# Patient Record
Sex: Female | Born: 1945 | Race: White | Hispanic: No | Marital: Single | Smoking: Never smoker
Health system: Southern US, Community
[De-identification: ages and names within clinical notes are randomized; demographics above are authoritative.]

## PROBLEM LIST (undated history)

## (undated) DIAGNOSIS — K851 Biliary acute pancreatitis without necrosis or infection: Secondary | ICD-10-CM

## (undated) DIAGNOSIS — I10 Essential (primary) hypertension: Secondary | ICD-10-CM

## (undated) DIAGNOSIS — N189 Chronic kidney disease, unspecified: Secondary | ICD-10-CM

## (undated) DIAGNOSIS — R519 Headache, unspecified: Secondary | ICD-10-CM

## (undated) DIAGNOSIS — R7982 Elevated C-reactive protein (CRP): Secondary | ICD-10-CM

## (undated) DIAGNOSIS — E28319 Asymptomatic premature menopause: Secondary | ICD-10-CM

## (undated) DIAGNOSIS — E7800 Pure hypercholesterolemia, unspecified: Secondary | ICD-10-CM

## (undated) HISTORY — PX: COLONOSCOPY: SHX174

## (undated) HISTORY — DX: Asymptomatic premature menopause: E28.319

## (undated) HISTORY — PX: BREAST BIOPSY: SHX20

## (undated) HISTORY — PX: CHOLECYSTECTOMY: SHX55

## (undated) HISTORY — DX: Essential (primary) hypertension: I10

## (undated) HISTORY — PX: ENDOMETRIAL BIOPSY: SHX622

---

## 2006-12-04 ENCOUNTER — Ambulatory Visit: Payer: Self-pay | Admitting: Unknown Physician Specialty

## 2009-02-20 ENCOUNTER — Ambulatory Visit: Payer: Self-pay

## 2010-04-25 ENCOUNTER — Ambulatory Visit: Payer: Self-pay

## 2011-06-11 ENCOUNTER — Ambulatory Visit: Payer: Self-pay

## 2012-08-31 ENCOUNTER — Ambulatory Visit: Payer: Self-pay

## 2014-01-17 ENCOUNTER — Ambulatory Visit: Payer: Self-pay | Admitting: Family Medicine

## 2014-03-31 ENCOUNTER — Ambulatory Visit: Payer: Self-pay | Admitting: Unknown Physician Specialty

## 2014-07-17 LAB — SURGICAL PATHOLOGY

## 2016-02-26 ENCOUNTER — Other Ambulatory Visit: Payer: Self-pay | Admitting: Family Medicine

## 2016-02-26 DIAGNOSIS — Z1231 Encounter for screening mammogram for malignant neoplasm of breast: Secondary | ICD-10-CM

## 2016-03-07 ENCOUNTER — Ambulatory Visit
Admission: RE | Admit: 2016-03-07 | Discharge: 2016-03-07 | Disposition: A | Discharge status: Home / Self Care | Payer: Managed Care, Other (non HMO) | Source: Ambulatory Visit | Attending: Family Medicine | Admitting: Family Medicine

## 2016-03-07 ENCOUNTER — Encounter: Payer: Self-pay | Admitting: Radiology

## 2016-03-07 DIAGNOSIS — Z1231 Encounter for screening mammogram for malignant neoplasm of breast: Secondary | ICD-10-CM | POA: Insufficient documentation

## 2016-09-04 ENCOUNTER — Encounter: Payer: Self-pay | Admitting: Obstetrics & Gynecology

## 2016-09-05 ENCOUNTER — Ambulatory Visit (INDEPENDENT_AMBULATORY_CARE_PROVIDER_SITE_OTHER): Payer: Managed Care, Other (non HMO) | Admitting: Obstetrics & Gynecology

## 2016-09-05 ENCOUNTER — Encounter: Payer: Self-pay | Admitting: Obstetrics & Gynecology

## 2016-09-05 VITALS — BP 140/80 | HR 77 | Ht 62.0 in | Wt 163.0 lb

## 2016-09-05 DIAGNOSIS — Z0000 Encounter for general adult medical examination without abnormal findings: Secondary | ICD-10-CM

## 2016-09-05 DIAGNOSIS — Z1211 Encounter for screening for malignant neoplasm of colon: Secondary | ICD-10-CM | POA: Diagnosis not present

## 2016-09-05 DIAGNOSIS — Z124 Encounter for screening for malignant neoplasm of cervix: Secondary | ICD-10-CM | POA: Diagnosis not present

## 2016-09-05 DIAGNOSIS — Z1231 Encounter for screening mammogram for malignant neoplasm of breast: Secondary | ICD-10-CM

## 2016-09-05 DIAGNOSIS — Z01419 Encounter for gynecological examination (general) (routine) without abnormal findings: Secondary | ICD-10-CM | POA: Diagnosis not present

## 2016-09-05 DIAGNOSIS — Z1239 Encounter for other screening for malignant neoplasm of breast: Secondary | ICD-10-CM

## 2016-09-05 NOTE — Patient Instructions (Addendum)
PAP every three years Mammogram every year Colonoscopy every 5- 10 years Labs yearly (with PCP)

## 2016-09-05 NOTE — Progress Notes (Signed)
HPI:      [name] is a 71 y.o. G1P1001 who LMP was in the past, she presents today for her annual examination.  The patient has no complaints today. The patient is not sexually active. Herlast pap: approximate date 2014 and was normal and last mammogram: approximate date 2018 and was normal.  The patient does perform self breast exams.  There is no notable family history of breast or ovarian cancer in her family. The patient is not taking hormone replacement therapy. Patient denies post-menopausal vaginal bleeding.   The patient has regular exercise: yes. The patient denies current symptoms of depression.    GYN Hx: Last Colonoscopy:4 years ago. Normal.  Last DEXA: NEVER ago.    PMHx: Past Medical History:  Diagnosis Date  . Hypertension   . Premature menopause    Past Surgical History:  Procedure Laterality Date  . BREAST BIOPSY Right    neg  . CHOLECYSTECTOMY    . ENDOMETRIAL BIOPSY     Family History  Problem Relation Age of Onset  . Alzheimer's disease Mother   . Hypertension Mother   . Hypertension Father   . Liver cancer Father   . Hypertension Sister   . Kidney cancer Sister    Social History  Substance Use Topics  . Smoking status: Never Smoker  . Smokeless tobacco: Never Used  . Alcohol use No    Current Outpatient Prescriptions:  .  amLODipine (NORVASC) 5 MG tablet, TAKE 1 TABLET BY MOUTH ONCE DAILY, Disp: , Rfl:  .  amLODipine (NORVASC) 5 MG tablet, , Disp: , Rfl:  .  aspirin EC 81 MG tablet, Take by mouth., Disp: , Rfl:  .  atenolol (TENORMIN) 50 MG tablet, , Disp: , Rfl:  .  Calcium Carbonate-Vitamin D 600-400 MG-UNIT tablet, Take by mouth., Disp: , Rfl:  .  folic acid (FOLVITE) 800 MCG tablet, Take by mouth., Disp: , Rfl:  .  ibuprofen (ADVIL,MOTRIN) 600 MG tablet, Take by mouth., Disp: , Rfl:  .  lisinopril-hydrochlorothiazide (PRINZIDE,ZESTORETIC) 20-12.5 MG tablet, , Disp: , Rfl:  .  simvastatin (ZOCOR) 20 MG tablet, , Disp: , Rfl:    Allergies: Meperidine  Review of Systems  Constitutional: Negative for chills, fever and malaise/fatigue.  HENT: Negative for congestion, sinus pain and sore throat.   Eyes: Negative for blurred vision and pain.  Respiratory: Negative for cough and wheezing.   Cardiovascular: Negative for chest pain and leg swelling.  Gastrointestinal: Negative for abdominal pain, constipation, diarrhea, heartburn, nausea and vomiting.  Genitourinary: Negative for dysuria, frequency, hematuria and urgency.  Musculoskeletal: Negative for back pain, joint pain, myalgias and neck pain.  Skin: Negative for itching and rash.  Neurological: Negative for dizziness, tremors and weakness.  Endo/Heme/Allergies: Does not bruise/bleed easily.  Psychiatric/Behavioral: Negative for depression. The patient is not nervous/anxious and does not have insomnia.     Objective: BP 140/80   Pulse 77   Ht 5\' 2"  (1.575 m)   Wt 163 lb (73.9 kg)   BMI 29.81 kg/m   Filed Weights   09/05/16 1419  Weight: 163 lb (73.9 kg)   Body mass index is 29.81 kg/m. Physical Exam  Constitutional: She is oriented to person, place, and time. She appears well-developed and well-nourished. No distress.  Genitourinary: Rectum normal, vagina normal and uterus normal. Pelvic exam was performed with patient supine. There is no rash or lesion on the right labia. There is no rash or lesion on the left labia. Vagina exhibits  no lesion. No bleeding in the vagina. Right adnexum does not display mass and does not display tenderness. Left adnexum does not display mass and does not display tenderness. Cervix does not exhibit motion tenderness, lesion, friability or polyp.   Uterus is mobile and midaxial. Uterus is not enlarged or exhibiting a mass.  HENT:  Head: Normocephalic and atraumatic. Head is without laceration.  Right Ear: Hearing normal.  Left Ear: Hearing normal.  Nose: No epistaxis.  No foreign bodies.  Mouth/Throat: Uvula is midline,  oropharynx is clear and moist and mucous membranes are normal.  Eyes: Pupils are equal, round, and reactive to light.  Neck: Normal range of motion. Neck supple. No thyromegaly present.  Cardiovascular: Normal rate and regular rhythm.  Exam reveals no gallop and no friction rub.   No murmur heard. Pulmonary/Chest: Effort normal and breath sounds normal. No respiratory distress. She has no wheezes. Right breast exhibits no mass, no skin change and no tenderness. Left breast exhibits no mass, no skin change and no tenderness.  Abdominal: Soft. Bowel sounds are normal. She exhibits no distension. There is no tenderness. There is no rebound.  Musculoskeletal: Normal range of motion.  Neurological: She is alert and oriented to person, place, and time. No cranial nerve deficit.  Skin: Skin is warm and dry.  Psychiatric: She has a normal mood and affect. Judgment normal.  Vitals reviewed.   Assessment: Annual Exam 1. Annual physical exam   2. Screening for breast cancer   3. Screening for cervical cancer   4. Screen for colon cancer     Plan:            1.  Cervical Screening-  Pap smear done today  2. Breast screening- Exam annually and mammogram scheduled  3. Colonoscopy every 5 years, Hemoccult testing \  4. Labs managed by PCP  5. Counseling for hormonal therapy: none     F/U  Return in about 1 year (around 09/05/2017) for Follow up.  Annamarie MajorPaul Ellamarie Naeve, MD, Merlinda FrederickFACOG [facility], [facility] 09/05/2016  2:27 PM

## 2016-09-09 LAB — PAP IG (IMAGE GUIDED): PAP Smear Comment: 0

## 2016-09-11 LAB — FECAL OCCULT BLOOD, IMMUNOCHEMICAL: FECAL OCCULT BLD: NEGATIVE

## 2017-04-14 ENCOUNTER — Other Ambulatory Visit: Payer: Self-pay

## 2017-04-14 ENCOUNTER — Other Ambulatory Visit: Payer: Self-pay | Admitting: Family Medicine

## 2017-04-14 DIAGNOSIS — Z1231 Encounter for screening mammogram for malignant neoplasm of breast: Secondary | ICD-10-CM

## 2017-04-16 ENCOUNTER — Ambulatory Visit
Admission: RE | Admit: 2017-04-16 | Discharge: 2017-04-16 | Disposition: A | Discharge status: Home / Self Care | Payer: Medicare HMO | Source: Ambulatory Visit | Attending: Family Medicine | Admitting: Family Medicine

## 2017-04-16 DIAGNOSIS — Z1231 Encounter for screening mammogram for malignant neoplasm of breast: Secondary | ICD-10-CM | POA: Insufficient documentation

## 2017-12-30 ENCOUNTER — Ambulatory Visit (INDEPENDENT_AMBULATORY_CARE_PROVIDER_SITE_OTHER): Payer: Medicare HMO | Admitting: Obstetrics & Gynecology

## 2017-12-30 ENCOUNTER — Encounter: Payer: Self-pay | Admitting: Obstetrics & Gynecology

## 2017-12-30 VITALS — BP 140/80 | Ht 62.0 in | Wt 156.0 lb

## 2017-12-30 DIAGNOSIS — N8110 Cystocele, unspecified: Secondary | ICD-10-CM

## 2017-12-30 DIAGNOSIS — Z1239 Encounter for other screening for malignant neoplasm of breast: Secondary | ICD-10-CM

## 2017-12-30 DIAGNOSIS — Z0000 Encounter for general adult medical examination without abnormal findings: Secondary | ICD-10-CM

## 2017-12-30 DIAGNOSIS — Z01411 Encounter for gynecological examination (general) (routine) with abnormal findings: Secondary | ICD-10-CM | POA: Diagnosis not present

## 2017-12-30 NOTE — Progress Notes (Signed)
HPI:      [name] is a 72 y.o. G1P1001 who LMP was in the past, she presents today for her annual examination.  The patient has no complaints today. The patient is not currently sexually active. Herlast pap: approximate date 2018 and was normal and last mammogram: approximate date 2018 and was normal.  The patient does perform self breast exams.  There is no notable family history of breast or ovarian cancer in her family. The patient is not taking hormone replacement therapy. Patient denies post-menopausal vaginal bleeding.   The patient has regular exercise: yes. The patient denies current symptoms of depression.  Mild urinary incontinence w stress; occas bulge feeling in vagina.  GYN Hx: Last Colonoscopy:5 years ago. Normal.  Last DEXA: never ago.    PMHx: Past Medical History:  Diagnosis Date  . Hypertension   . Premature menopause    Past Surgical History:  Procedure Laterality Date  . BREAST BIOPSY Right    neg  . CHOLECYSTECTOMY    . ENDOMETRIAL BIOPSY     Family History  Problem Relation Age of Onset  . Alzheimer's disease Mother   . Hypertension Mother   . Hypertension Father   . Liver cancer Father   . Hypertension Sister   . Kidney cancer Sister    Social History   Tobacco Use  . Smoking status: Never Smoker  . Smokeless tobacco: Never Used  Substance Use Topics  . Alcohol use: No  . Drug use: No    Current Outpatient Medications:  .  amLODipine (NORVASC) 5 MG tablet, TAKE 1 TABLET BY MOUTH ONCE DAILY, Disp: , Rfl:  .  aspirin EC 81 MG tablet, Take by mouth., Disp: , Rfl:  .  atenolol (TENORMIN) 50 MG tablet, , Disp: , Rfl:  .  lisinopril-hydrochlorothiazide (PRINZIDE,ZESTORETIC) 20-12.5 MG tablet, , Disp: , Rfl:  .  simvastatin (ZOCOR) 20 MG tablet, , Disp: , Rfl:  .  amLODipine (NORVASC) 5 MG tablet, , Disp: , Rfl:  .  Calcium Carbonate-Vitamin D 600-400 MG-UNIT tablet, Take by mouth., Disp: , Rfl:  .  folic acid (FOLVITE) 800 MCG tablet, Take  by mouth., Disp: , Rfl:  .  ibuprofen (ADVIL,MOTRIN) 600 MG tablet, Take by mouth., Disp: , Rfl:  .  lisinopril-hydrochlorothiazide (PRINZIDE,ZESTORETIC) 20-12.5 MG tablet, Take by mouth., Disp: , Rfl:  .  simvastatin (ZOCOR) 20 MG tablet, Take by mouth., Disp: , Rfl:  Allergies: Meperidine  Review of Systems  Constitutional: Negative for chills, fever and malaise/fatigue.  HENT: Negative for congestion, sinus pain and sore throat.   Eyes: Negative for blurred vision and pain.  Respiratory: Negative for cough and wheezing.   Cardiovascular: Negative for chest pain and leg swelling.  Gastrointestinal: Positive for diarrhea. Negative for abdominal pain, constipation, heartburn, nausea and vomiting.  Genitourinary: Positive for urgency. Negative for dysuria, frequency and hematuria.  Musculoskeletal: Negative for back pain, joint pain, myalgias and neck pain.  Skin: Negative for itching and rash.  Neurological: Negative for dizziness, tremors and weakness.  Endo/Heme/Allergies: Does not bruise/bleed easily.  Psychiatric/Behavioral: Negative for depression. The patient is not nervous/anxious and does not have insomnia.    Objective: BP 140/80   Ht 5\' 2"  (1.575 m)   Wt 156 lb (70.8 kg)   BMI 28.53 kg/m   Filed Weights   12/30/17 0912  Weight: 156 lb (70.8 kg)   Body mass index is 28.53 kg/m. Physical Exam  Constitutional: She is oriented to person, place, and time.  She appears well-developed and well-nourished. No distress.  Genitourinary: Rectum normal, vagina normal and uterus normal. Pelvic exam was performed with patient supine. There is no rash or lesion on the right labia. There is no rash or lesion on the left labia. Vagina exhibits no lesion and no rugosity. No bleeding in the vagina. Right adnexum does not display mass and does not display tenderness. Left adnexum does not display mass and does not display tenderness. Cervix does not exhibit motion tenderness, lesion, friability  or polyp.   Uterus is mobile and midaxial. Uterus is not enlarged or exhibiting a mass.  Genitourinary Comments: Atrophy Mild distal cystocele  HENT:  Head: Normocephalic and atraumatic. Head is without laceration.  Right Ear: Hearing normal.  Left Ear: Hearing normal.  Nose: No epistaxis.  No foreign bodies.  Mouth/Throat: Uvula is midline, oropharynx is clear and moist and mucous membranes are normal.  Eyes: Pupils are equal, round, and reactive to light.  Neck: Normal range of motion. Neck supple. No thyromegaly present.  Cardiovascular: Normal rate and regular rhythm. Exam reveals no gallop and no friction rub.  No murmur heard. Pulmonary/Chest: Effort normal and breath sounds normal. No respiratory distress. She has no wheezes. Right breast exhibits no mass, no skin change and no tenderness. Left breast exhibits no mass, no skin change and no tenderness.  Abdominal: Soft. Bowel sounds are normal. She exhibits no distension. There is no tenderness. There is no rebound.  Musculoskeletal: Normal range of motion.  Neurological: She is alert and oriented to person, place, and time. No cranial nerve deficit.  Skin: Skin is warm and dry.  Psychiatric: She has a normal mood and affect. Judgment normal.  Vitals reviewed.  Assessment: Annual Exam 1. Annual physical exam   2. Screening for breast cancer    Plan:            1.  Cervical Screening-  Pap smear done today, Pap smear schedule reviewed with patient  2. Breast screening- Exam annually and mammogram scheduled  3. Colonoscopy every 5 years, Due this year. Hemoccult testing after age 38  4. Labs managed by PCP  5. Counseling for hormonal therapy: none  6. Mild cystocele w also mild GSI sx's.  Monitor. Tx options discussed.     F/U  Return in about 1 year (around 12/31/2018) for Annual.  Annamarie Major, MD, [facility], [facility] 12/30/2017  9:43 AM

## 2017-12-30 NOTE — Patient Instructions (Signed)
PAP every three years Mammogram every year    Call [phone] to schedule at [facility] Colonoscopy every 5 years    Call Dr Markham Jordan! Labs yearly (with PCP)

## 2018-05-28 ENCOUNTER — Ambulatory Visit
Admission: RE | Admit: 2018-05-28 | Discharge: 2018-05-28 | Disposition: A | Discharge status: Home / Self Care | Payer: Medicare HMO | Source: Ambulatory Visit | Attending: Obstetrics & Gynecology | Admitting: Obstetrics & Gynecology

## 2018-05-28 DIAGNOSIS — Z1239 Encounter for other screening for malignant neoplasm of breast: Secondary | ICD-10-CM

## 2018-05-28 DIAGNOSIS — Z1231 Encounter for screening mammogram for malignant neoplasm of breast: Secondary | ICD-10-CM | POA: Diagnosis not present

## 2018-05-31 ENCOUNTER — Encounter: Payer: Self-pay | Admitting: Obstetrics & Gynecology

## 2019-04-29 ENCOUNTER — Ambulatory Visit: Payer: Medicare HMO | Attending: Internal Medicine

## 2019-04-29 ENCOUNTER — Other Ambulatory Visit: Payer: Self-pay

## 2019-04-29 DIAGNOSIS — Z23 Encounter for immunization: Secondary | ICD-10-CM

## 2019-04-29 NOTE — Progress Notes (Signed)
   Covid-19 Vaccination Clinic  Name:  Janice Tran    MRN: [id number] DOB: 01/17/1946  04/29/2019  Janice Tran was observed post Covid-19 immunization for 15 minutes without incidence. She was provided with Vaccine Information Sheet and instruction to access the V-Safe system.   Janice Tran was instructed to call 911 with any severe reactions post vaccine: Marland Kitchen Difficulty breathing  . Swelling of your face and throat  . A fast heartbeat  . A bad rash all over your body  . Dizziness and weakness    Immunizations Administered    Name Date Dose VIS Date Route   Moderna COVID-19 Vaccine 04/29/2019  4:45 PM 0.5 mL 02/22/2019 Intramuscular   Manufacturer: Moderna   Lot: [id number]   NDC: 35456-256-38

## 2019-05-31 ENCOUNTER — Ambulatory Visit: Payer: Medicare HMO | Attending: Internal Medicine

## 2019-05-31 DIAGNOSIS — Z23 Encounter for immunization: Secondary | ICD-10-CM | POA: Insufficient documentation

## 2019-05-31 NOTE — Progress Notes (Signed)
   Covid-19 Vaccination Clinic  Name:  Janice Tran    MRN: [id number] DOB: 05/22/1945  05/31/2019  [name] was observed post Covid-19 immunization for 15 minutes without incident. She was provided with Vaccine Information Sheet and instruction to access the V-Safe system.   [name] was instructed to call 911 with any severe reactions post vaccine: Marland Kitchen Difficulty breathing  . Swelling of face and throat  . A fast heartbeat  . A bad rash all over body  . Dizziness and weakness   Immunizations Administered    Name Date Dose VIS Date Route   Moderna COVID-19 Vaccine 05/31/2019  3:42 PM 0.5 mL 02/22/2019 Intramuscular   Manufacturer: Moderna   Lot: [id number]   NDC: 17981-025-48

## 2019-07-07 ENCOUNTER — Other Ambulatory Visit: Payer: Self-pay | Admitting: Family Medicine

## 2019-07-07 DIAGNOSIS — Z1231 Encounter for screening mammogram for malignant neoplasm of breast: Secondary | ICD-10-CM

## 2019-07-27 ENCOUNTER — Ambulatory Visit
Admission: RE | Admit: 2019-07-27 | Discharge: 2019-07-27 | Disposition: A | Discharge status: Home / Self Care | Payer: Medicare HMO | Source: Ambulatory Visit | Attending: Family Medicine | Admitting: Family Medicine

## 2019-07-27 DIAGNOSIS — Z1231 Encounter for screening mammogram for malignant neoplasm of breast: Secondary | ICD-10-CM | POA: Insufficient documentation

## 2019-12-28 ENCOUNTER — Other Ambulatory Visit
Admission: RE | Admit: 2019-12-28 | Discharge: 2019-12-28 | Disposition: A | Discharge status: Home / Self Care | Payer: Medicare HMO | Source: Ambulatory Visit | Attending: Gastroenterology | Admitting: Gastroenterology

## 2019-12-28 ENCOUNTER — Other Ambulatory Visit: Payer: Self-pay

## 2019-12-28 DIAGNOSIS — Z01812 Encounter for preprocedural laboratory examination: Secondary | ICD-10-CM | POA: Insufficient documentation

## 2019-12-28 DIAGNOSIS — Z20822 Contact with and (suspected) exposure to covid-19: Secondary | ICD-10-CM | POA: Diagnosis not present

## 2019-12-28 LAB — SARS CORONAVIRUS 2 (TAT 6-24 HRS): SARS Coronavirus 2: NEGATIVE

## 2019-12-29 ENCOUNTER — Encounter: Payer: Self-pay | Admitting: Internal Medicine

## 2019-12-30 ENCOUNTER — Ambulatory Visit: Payer: Medicare HMO | Admitting: Certified Registered Nurse Anesthetist

## 2019-12-30 ENCOUNTER — Encounter
Admission: RE | Disposition: A | Discharge status: Home / Self Care | Payer: Self-pay | Source: Home / Self Care | Attending: Gastroenterology

## 2019-12-30 ENCOUNTER — Encounter: Payer: Self-pay | Admitting: Internal Medicine

## 2019-12-30 ENCOUNTER — Ambulatory Visit
Admission: RE | Admit: 2019-12-30 | Discharge: 2019-12-30 | Disposition: A | Discharge status: Home / Self Care | Payer: Medicare HMO | Attending: Gastroenterology | Admitting: Gastroenterology

## 2019-12-30 DIAGNOSIS — N189 Chronic kidney disease, unspecified: Secondary | ICD-10-CM | POA: Insufficient documentation

## 2019-12-30 DIAGNOSIS — I129 Hypertensive chronic kidney disease with stage 1 through stage 4 chronic kidney disease, or unspecified chronic kidney disease: Secondary | ICD-10-CM | POA: Diagnosis not present

## 2019-12-30 DIAGNOSIS — K5730 Diverticulosis of large intestine without perforation or abscess without bleeding: Secondary | ICD-10-CM | POA: Insufficient documentation

## 2019-12-30 DIAGNOSIS — K641 Second degree hemorrhoids: Secondary | ICD-10-CM | POA: Diagnosis not present

## 2019-12-30 DIAGNOSIS — Z1211 Encounter for screening for malignant neoplasm of colon: Secondary | ICD-10-CM | POA: Diagnosis present

## 2019-12-30 DIAGNOSIS — Z9049 Acquired absence of other specified parts of digestive tract: Secondary | ICD-10-CM | POA: Diagnosis not present

## 2019-12-30 DIAGNOSIS — Z79899 Other long term (current) drug therapy: Secondary | ICD-10-CM | POA: Insufficient documentation

## 2019-12-30 DIAGNOSIS — Z86010 Personal history of colonic polyps: Secondary | ICD-10-CM | POA: Insufficient documentation

## 2019-12-30 DIAGNOSIS — Z7982 Long term (current) use of aspirin: Secondary | ICD-10-CM | POA: Insufficient documentation

## 2019-12-30 DIAGNOSIS — E7800 Pure hypercholesterolemia, unspecified: Secondary | ICD-10-CM | POA: Insufficient documentation

## 2019-12-30 DIAGNOSIS — Z8371 Family history of colonic polyps: Secondary | ICD-10-CM | POA: Diagnosis not present

## 2019-12-30 HISTORY — DX: Elevated C-reactive protein (CRP): R79.82

## 2019-12-30 HISTORY — PX: COLONOSCOPY WITH PROPOFOL: SHX5780

## 2019-12-30 HISTORY — DX: Pure hypercholesterolemia, unspecified: E78.00

## 2019-12-30 HISTORY — DX: Headache, unspecified: R51.9

## 2019-12-30 HISTORY — DX: Biliary acute pancreatitis without necrosis or infection: K85.10

## 2019-12-30 HISTORY — DX: Chronic kidney disease, unspecified: N18.9

## 2019-12-30 SURGERY — COLONOSCOPY WITH PROPOFOL
Anesthesia: General

## 2019-12-30 MED ORDER — LIDOCAINE HCL (CARDIAC) PF 100 MG/5ML IV SOSY
PREFILLED_SYRINGE | INTRAVENOUS | Status: DC | PRN
  Administered 2019-12-30: 50 mg via INTRAVENOUS

## 2019-12-30 MED ORDER — PROPOFOL 10 MG/ML IV BOLUS
INTRAVENOUS | Status: DC | PRN
  Administered 2019-12-30: 17 mg via INTRAVENOUS
  Administered 2019-12-30: 80 mg via INTRAVENOUS

## 2019-12-30 MED ORDER — SODIUM CHLORIDE 0.9 % IV SOLN: INTRAVENOUS | Status: DC

## 2019-12-30 MED ORDER — PROPOFOL 500 MG/50ML IV EMUL
INTRAVENOUS | Status: DC | PRN
  Administered 2019-12-30: 130 ug/kg/min via INTRAVENOUS

## 2019-12-30 MED ORDER — PROPOFOL 500 MG/50ML IV EMUL
INTRAVENOUS | Status: AC
  Filled 2019-12-30: qty 50

## 2019-12-30 NOTE — H&P (Signed)
Outpatient short stay form Pre-procedure 12/30/2019 10:35 AM Merlyn Lot MD, MPH  Primary Physician: Dr. Larwance Sachs  Reason for visit:  Family History of polyps  History of present illness:   74 y/o lady with family history of polyps of unknown size here for colonoscopy. Last colonoscopy was in 2016 with benign mucosa on small removal of polyp. History of cholecystectomy but no family history of GI malignancies. Denies any other significant GI symptoms   Current Facility-Administered Medications:  .  0.9 %  sodium chloride infusion, , Intravenous, Continuous, Gaige Fussner, Rossie Muskrat, MD, Last Rate: 20 mL/hr at 12/30/19 1003, New Bag at 12/30/19 1003  Medications Prior to Admission  Medication Sig Dispense Refill Last Dose  . amLODipine (NORVASC) 5 MG tablet TAKE 1 TABLET BY MOUTH ONCE DAILY   12/29/2019 at Unknown time  . atenolol (TENORMIN) 50 MG tablet    12/30/2019 at Unknown time  . Calcium Carbonate-Vitamin D (CALTRATE 600+D PO) Take by mouth.   Past Week at Unknown time  . Cholecalciferol 25 MCG (1000 UT) tablet Take 1,000 Units by mouth daily.   Past Week at Unknown time  . Coenzyme Q10 (UBIDECARENONE) POWD by Does not apply route.   Past Week at Unknown time  . folic acid (FOLVITE) 800 MCG tablet Take by mouth.   Past Week at Unknown time  . lisinopril-hydrochlorothiazide (PRINZIDE,ZESTORETIC) 20-12.5 MG tablet    12/30/2019 at Unknown time  . magnesium oxide (MAG-OX) 400 MG tablet Take 400 mg by mouth daily.   Past Week at Unknown time  . psyllium (REGULOID) 0.52 g capsule Take 0.52 g by mouth daily.   Past Week at Unknown time  . saccharomyces boulardii (FLORASTOR) 250 MG capsule Take 250 mg by mouth 2 (two) times daily.     . simvastatin (ZOCOR) 20 MG tablet Take by mouth.   12/29/2019 at Unknown time  . vitamin E (VITAMIN E) 180 MG (400 UNITS) capsule Take 400 Units by mouth daily.   Past Month at Unknown time  . amLODipine (NORVASC) 5 MG tablet      . aspirin EC 81 MG tablet Take  by mouth.   12/23/2019  . Calcium Carbonate-Vitamin D 600-400 MG-UNIT tablet Take by mouth.     Marland Kitchen ibuprofen (ADVIL,MOTRIN) 600 MG tablet Take by mouth. (Patient not taking: Reported on 12/30/2019)   Not Taking at Unknown time  . lisinopril-hydrochlorothiazide (PRINZIDE,ZESTORETIC) 20-12.5 MG tablet Take by mouth.     . simvastatin (ZOCOR) 20 MG tablet         Allergies  Allergen Reactions  . Meperidine Other (See Comments)    Caused numbness of her lips and hands.     Past Medical History:  Diagnosis Date  . Acute gallstone pancreatitis   . Chronic kidney disease    stage 3  . CRP elevated   . Headache   . Hypercholesterolemia   . Hypertension   . Premature menopause     Review of systems:  Otherwise negative.    Physical Exam  Gen: Alert, oriented. Appears stated age.  HEENT: [place]/AT. PERRLA. Lungs: No respiratory distress Abd: soft, benign, no masses.  Ext: No edema.    Planned procedures: Proceed with colonoscopy. The patient understands the nature of the planned procedure, indications, risks, alternatives and potential complications including but not limited to bleeding, infection, perforation, damage to internal organs and possible oversedation/side effects from anesthesia. The patient agrees and gives consent to proceed.  Please refer to procedure notes for findings, recommendations and  patient disposition/instructions.     Merlyn Lot MD, MPH Gastroenterology 12/30/2019  10:35 AM

## 2019-12-30 NOTE — Interval H&P Note (Signed)
History and Physical Interval Note:  12/30/2019 10:38 AM  Janice Tran  has presented today for surgery, with the diagnosis of FAMILY HX.OF COLON POLYPS.  The various methods of treatment have been discussed with the patient and family. After consideration of risks, benefits and other options for treatment, the patient has consented to  Procedure(s): COLONOSCOPY WITH PROPOFOL (N/A) as a surgical intervention.  The patient's history has been reviewed, patient examined, no change in status, stable for surgery.  I have reviewed the patient's chart and labs.  Questions were answered to the patient's satisfaction.     Regis Bill  Ok to proceed with colonoscopy

## 2019-12-30 NOTE — Transfer of Care (Signed)
Immediate Anesthesia Transfer of Care Note  Patient: Janice Tran  Procedure(s) Performed: COLONOSCOPY WITH PROPOFOL (N/A )  Patient Location: PACU and Endoscopy Unit  Anesthesia Type:General  Level of Consciousness: drowsy  Airway & Oxygen Therapy: Patient Spontanous Breathing  Post-op Assessment: Report given to RN and Post -op Vital signs reviewed and stable  Post vital signs: Reviewed and stable  Last Vitals:  Vitals Value Taken Time  BP 99/53 12/30/19 1118  Temp 36.6 C 12/30/19 1118  Pulse 63 12/30/19 1119  Resp 17 12/30/19 1119  SpO2 96 % 12/30/19 1119  Vitals shown include unvalidated device data.  Last Pain:  Vitals:   12/30/19 1118  TempSrc: Temporal         Complications: No complications documented.

## 2019-12-30 NOTE — Anesthesia Preprocedure Evaluation (Signed)
Anesthesia Evaluation  Patient identified by MRN, date of birth, ID band Patient awake    Reviewed: Allergy & Precautions, H&P , NPO status , Patient's Chart, lab work & pertinent test results, reviewed documented beta blocker date and time   History of Anesthesia Complications Negative for: history of anesthetic complications  Airway Mallampati: II  TM Distance: >3 FB Neck ROM: full    Dental  (+) Dental Advidsory Given, Caps   Pulmonary neg pulmonary ROS,    Pulmonary exam normal breath sounds clear to auscultation       Cardiovascular Exercise Tolerance: Good hypertension, (-) angina(-) Past MI and (-) Cardiac Stents Normal cardiovascular exam(-) dysrhythmias (-) Valvular Problems/Murmurs Rhythm:regular Rate:Normal     Neuro/Psych negative neurological ROS  negative psych ROS   GI/Hepatic negative GI ROS, Neg liver ROS,   Endo/Other  negative endocrine ROS  Renal/GU CRFRenal disease  negative genitourinary   Musculoskeletal   Abdominal   Peds  Hematology negative hematology ROS (+)   Anesthesia Other Findings Past Medical History: No date: Acute gallstone pancreatitis No date: Chronic kidney disease     Comment:  stage 3 No date: CRP elevated No date: Headache No date: Hypercholesterolemia No date: Hypertension No date: Premature menopause   Reproductive/Obstetrics negative OB ROS                             Anesthesia Physical Anesthesia Plan  ASA: II  Anesthesia Plan: General   Post-op Pain Management:    Induction: Intravenous  PONV Risk Score and Plan: 3 and Propofol infusion and TIVA  Airway Management Planned: Natural Airway and Nasal Cannula  Additional Equipment:   Intra-op Plan:   Post-operative Plan:   Informed Consent: I have reviewed the patients History and Physical, chart, labs and discussed the procedure including the risks, benefits and  alternatives for the proposed anesthesia with the patient or authorized representative who has indicated his/her understanding and acceptance.     Dental Advisory Given  Plan Discussed with: Anesthesiologist, CRNA and Surgeon  Anesthesia Plan Comments:         Anesthesia Quick Evaluation

## 2019-12-30 NOTE — OR Nursing (Signed)
PT REPORTS DIZZINESS. HOB FLATTENED. BP WNL . OCCASIONAL PULSE WILL BRADY DOWN TO 50. ANESTHESIA MD DR Karlton Lemon NOTIFIED . MD REPORTS HE WILL ROUND TO SEE PT PRIOR TO DISCHARGE. REPORT GIVEN TO RN JULIE PATTON  WHO WILL BE ASSUMING CARE.

## 2019-12-30 NOTE — Op Note (Signed)
[facility] Patient Name: Janice Tran Procedure Date: 12/30/2019 10:40 AM MRN: [id number] Account #: [id number] Date of Birth: Jan 29, 1946 Admit Type: Outpatient Age: 74 Room: [facility] ENDO ROOM 3 Gender: Female Note Status: Finalized Procedure:             Colonoscopy Indications:           Family history of colonic polyps in a first-degree                         relative Providers:             Andrey Farmer MD, MD Referring MD:          No Local Md, MD (Referring MD) Medicines:             Monitored Anesthesia Care Complications:         No immediate complications. Procedure:             Pre-Anesthesia Assessment:                        - Prior to the procedure, a History and Physical was                         performed, and patient medications and allergies were                         reviewed. The patient is competent. The risks and                         benefits of the procedure and the sedation options and                         risks were discussed with the patient. All questions                         were answered and informed consent was obtained.                         Patient identification and proposed procedure were                         verified by the physician, the nurse, the anesthetist                         and the technician in the endoscopy suite. Mental                         Status Examination: alert and oriented. Airway                         Examination: normal oropharyngeal airway and neck                         mobility. Respiratory Examination: clear to                         auscultation. CV Examination: normal. Prophylactic  Antibiotics: The patient does not require prophylactic                         antibiotics. Prior Anticoagulants: The patient has                         taken no previous anticoagulant or antiplatelet                         agents. ASA Grade Assessment: II  - A patient with mild                         systemic disease. After reviewing the risks and                         benefits, the patient was deemed in satisfactory                         condition to undergo the procedure. The anesthesia                         plan was to use monitored anesthesia care (MAC).                         Immediately prior to administration of medications,                         the patient was re-assessed for adequacy to receive                         sedatives. The heart rate, respiratory rate, oxygen                         saturations, blood pressure, adequacy of pulmonary                         ventilation, and response to care were monitored                         throughout the procedure. The physical status of the                         patient was re-assessed after the procedure.                        After obtaining informed consent, the colonoscope was                         passed under direct vision. Throughout the procedure,                         the patient's blood pressure, pulse, and oxygen                         saturations were monitored continuously. The                         Colonoscope was introduced through the anus and  advanced to the the cecum, identified by appendiceal                         orifice and ileocecal valve. The colonoscopy was                         technically difficult and complex due to restricted                         mobility of the colon. Successful completion of the                         procedure was aided by withdrawing the scope and                         replacing with the pediatric colonoscope. The patient                         tolerated the procedure well. The quality of the bowel                         preparation was good. Findings:      The perianal and digital rectal examinations were normal.      Multiple small-mouthed diverticula were found in the sigmoid  colon and       descending colon.      Non-bleeding internal hemorrhoids were found during retroflexion. The       hemorrhoids were Grade II (internal hemorrhoids that prolapse but reduce       spontaneously).      The exam was otherwise without abnormality on direct and retroflexion       views. Impression:            - Diverticulosis in the sigmoid colon and in the                         descending colon.                        - Non-bleeding internal hemorrhoids.                        - The examination was otherwise normal on direct and                         retroflexion views.                        - No specimens collected. Recommendation:        - Discharge patient to home.                        - Resume previous diet.                        - Continue present medications.                        - Repeat colonoscopy in 10 years for screening  purposes.                        - Return to referring physician as previously                         scheduled. Procedure Code(s):     --- Professional ---                        [phone], Colonoscopy, flexible; diagnostic, including                         collection of specimen(s) by brushing or washing, when                         performed (separate procedure) Diagnosis Code(s):     --- Professional ---                        K64.1, Second degree hemorrhoids                        Z83.71, Family history of colonic polyps                        K57.30, Diverticulosis of large intestine without                         perforation or abscess without bleeding CPT copyright 2019 [employer]. All rights reserved. The codes documented in this report are preliminary and upon coder review may  be revised to meet current compliance requirements. Andrey Farmer, MD Andrey Farmer MD, MD 12/30/2019 11:16:58 AM Number of Addenda: 0 Note Initiated On: 12/30/2019 10:40 AM Scope Withdrawal Time: 0  hours 9 minutes 0 seconds  Total Procedure Duration: 0 hours 24 minutes 22 seconds  Estimated Blood Loss:  Estimated blood loss: none.      [facility]

## 2019-12-31 NOTE — Anesthesia Postprocedure Evaluation (Signed)
Anesthesia Post Note  Patient: Janice Tran  Procedure(s) Performed: COLONOSCOPY WITH PROPOFOL (N/A )  Patient location during evaluation: Endoscopy Anesthesia Type: General Level of consciousness: awake and alert Pain management: pain level controlled Vital Signs Assessment: post-procedure vital signs reviewed and stable Respiratory status: spontaneous breathing, nonlabored ventilation, respiratory function stable and patient connected to nasal cannula oxygen Cardiovascular status: blood pressure returned to baseline and stable Postop Assessment: no apparent nausea or vomiting Anesthetic complications: no   No complications documented.   Last Vitals:  Vitals:   12/30/19 1208 12/30/19 1218  BP: (!) 120/52 (!) 119/55  Pulse:    Resp:    Temp:    SpO2:      Last Pain:  Vitals:   12/30/19 1218  TempSrc:   PainSc: 0-No pain                 Lenard Simmer

## 2020-01-02 ENCOUNTER — Encounter: Payer: Self-pay | Admitting: Gastroenterology

## 2020-09-03 ENCOUNTER — Other Ambulatory Visit: Payer: Self-pay | Admitting: Family Medicine

## 2020-09-03 DIAGNOSIS — Z1231 Encounter for screening mammogram for malignant neoplasm of breast: Secondary | ICD-10-CM

## 2020-09-14 ENCOUNTER — Ambulatory Visit
Admission: RE | Admit: 2020-09-14 | Discharge: 2020-09-14 | Disposition: A | Discharge status: Home / Self Care | Payer: Medicare HMO | Source: Ambulatory Visit | Attending: Family Medicine | Admitting: Family Medicine

## 2020-09-14 ENCOUNTER — Other Ambulatory Visit: Payer: Self-pay

## 2020-09-14 DIAGNOSIS — Z1231 Encounter for screening mammogram for malignant neoplasm of breast: Secondary | ICD-10-CM | POA: Insufficient documentation

## 2021-09-11 ENCOUNTER — Other Ambulatory Visit: Payer: Self-pay | Admitting: Family Medicine

## 2021-09-11 DIAGNOSIS — Z1231 Encounter for screening mammogram for malignant neoplasm of breast: Secondary | ICD-10-CM

## 2021-10-09 ENCOUNTER — Ambulatory Visit
Admission: RE | Admit: 2021-10-09 | Discharge: 2021-10-09 | Disposition: A | Discharge status: Home / Self Care | Payer: Medicare HMO | Source: Ambulatory Visit | Attending: Family Medicine | Admitting: Family Medicine

## 2021-10-09 DIAGNOSIS — Z1231 Encounter for screening mammogram for malignant neoplasm of breast: Secondary | ICD-10-CM | POA: Insufficient documentation

## 2021-12-24 IMAGING — MG MM DIGITAL SCREENING BILAT W/ TOMO AND CAD
6 of 10 series · 6 of 30 positions shown · non-contrast
Comparison: Previous exam(s).

CLINICAL DATA: Screening.

EXAM:
DIGITAL SCREENING BILATERAL MAMMOGRAM WITH TOMOSYNTHESIS AND CAD
TECHNIQUE: Bilateral screening digital craniocaudal and mediolateral oblique
mammograms were obtained. Bilateral screening digital breast
tomosynthesis was performed. The images were evaluated with
computer-aided detection.

[L CC synth-2D (1 of 2)]
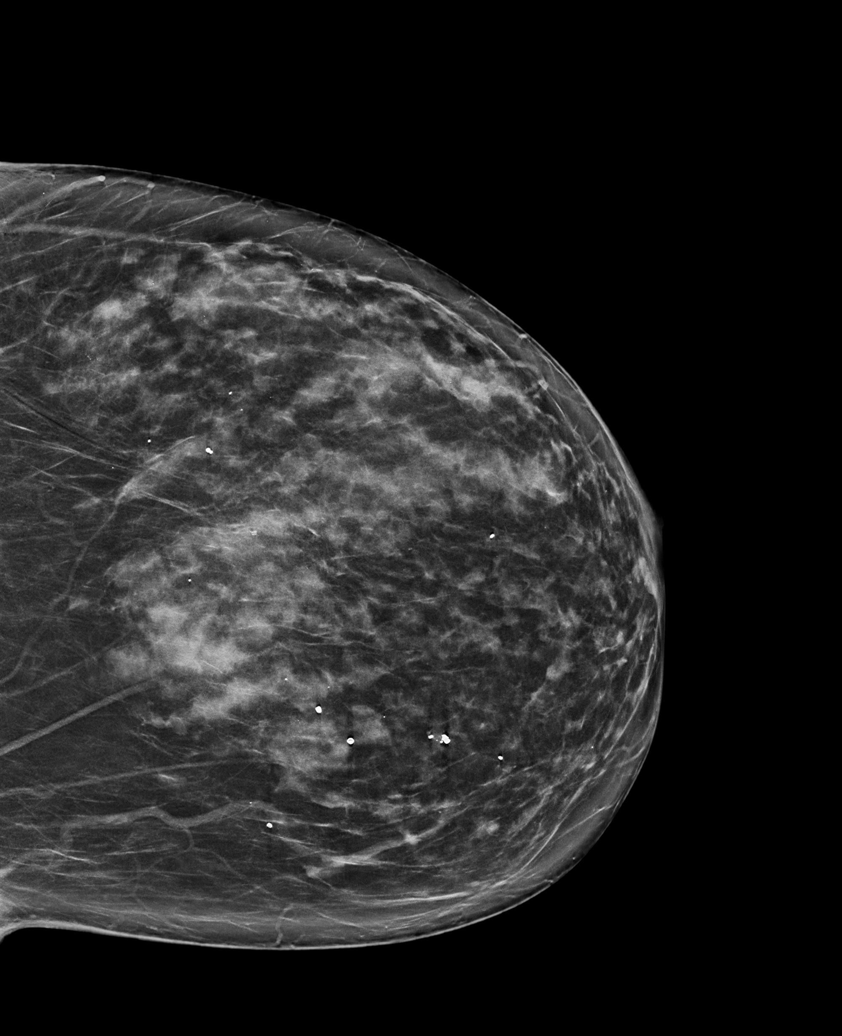

[R CC synth-2D]
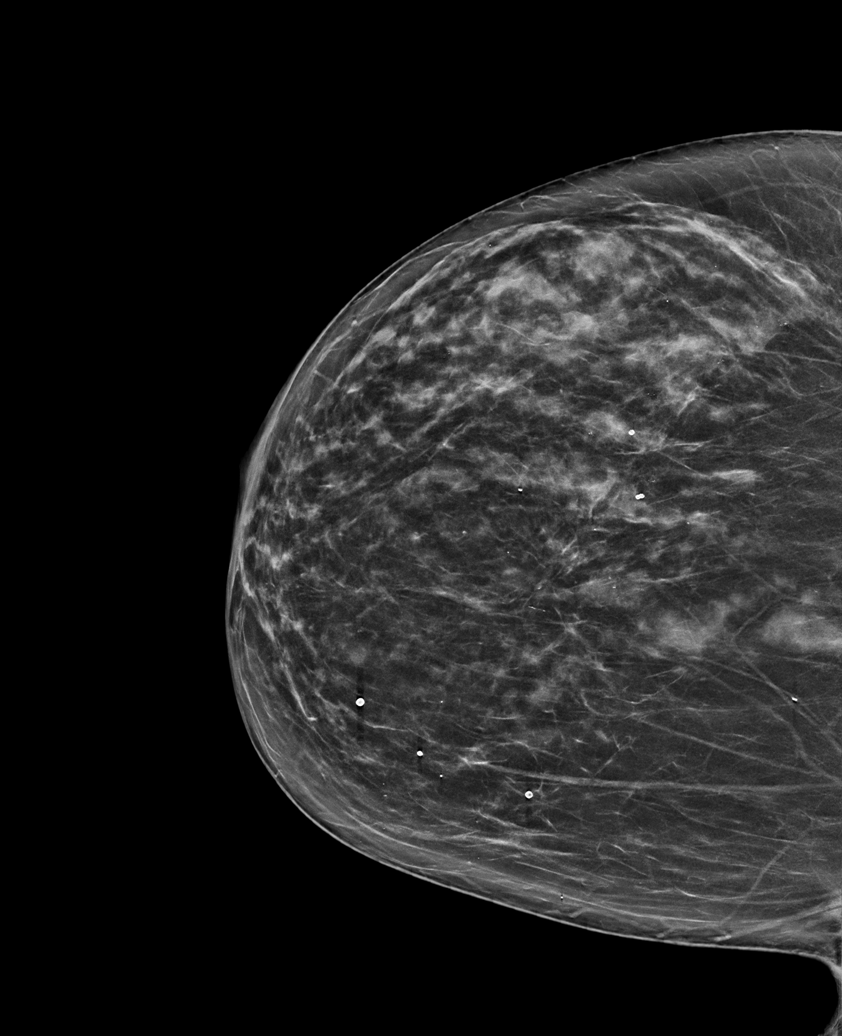

[L CC synth-2D (2 of 2)]
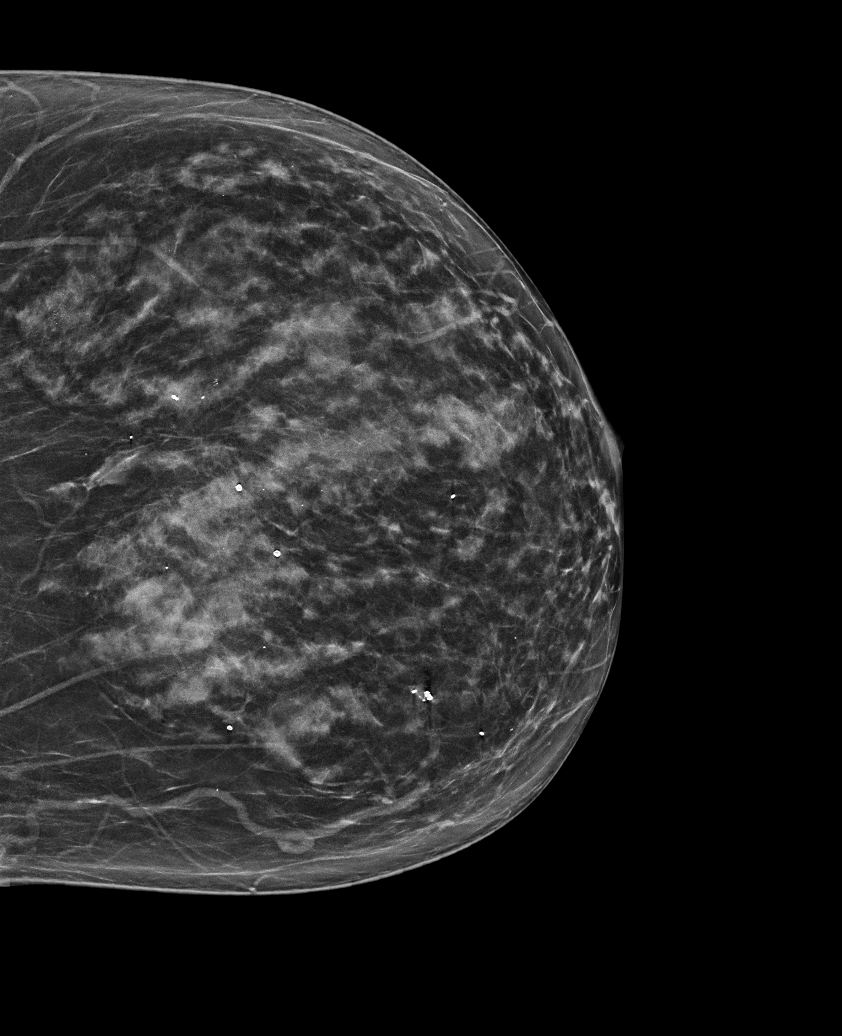

[L MLO synth-2D]
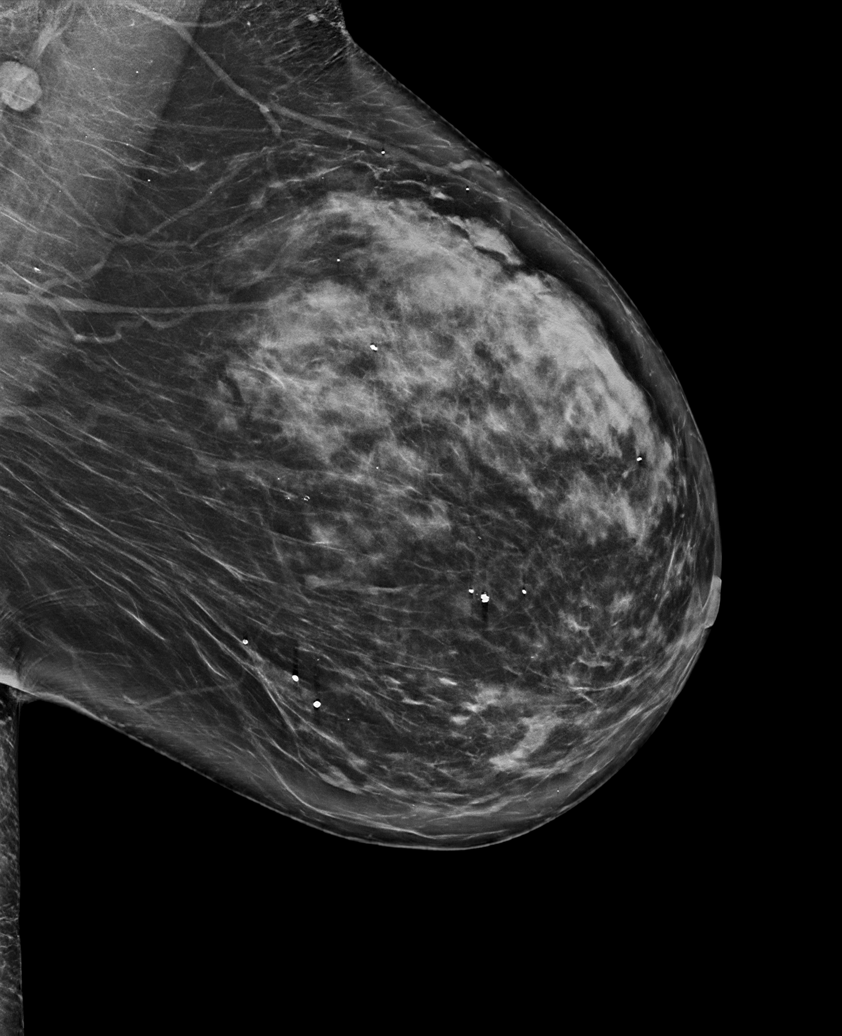

[R MLO synth-2D]
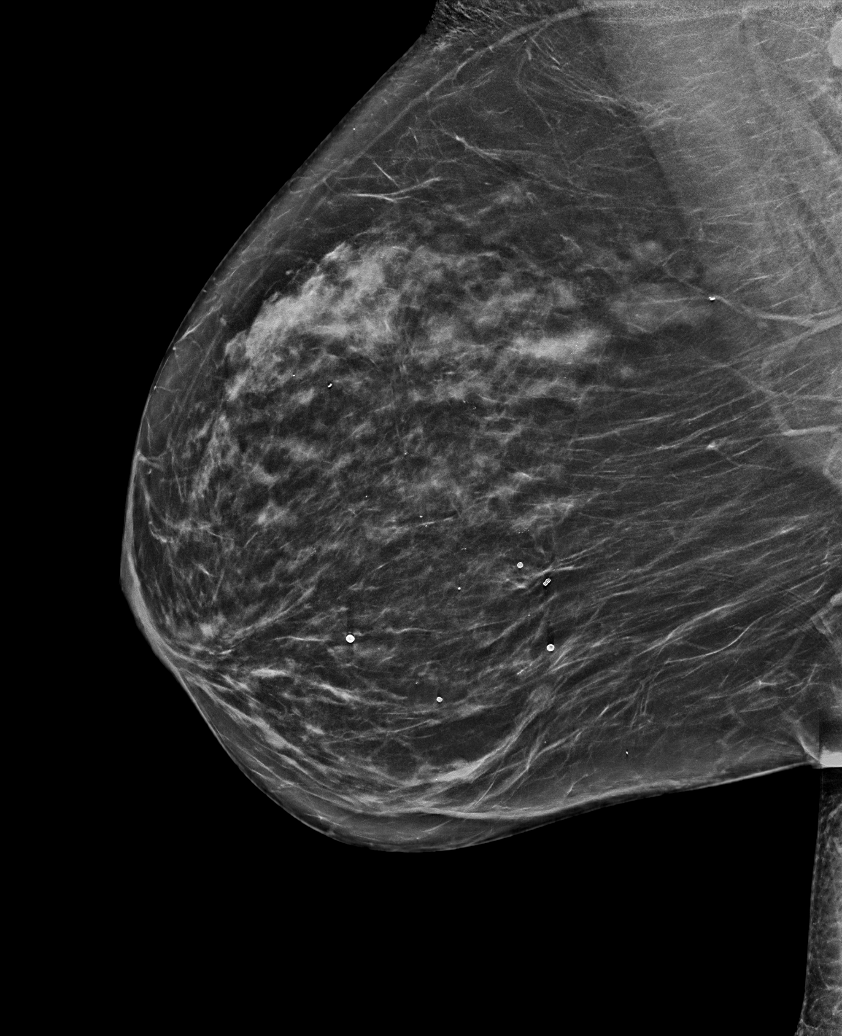

[R CC tomo · tomo slice 35/70.0]
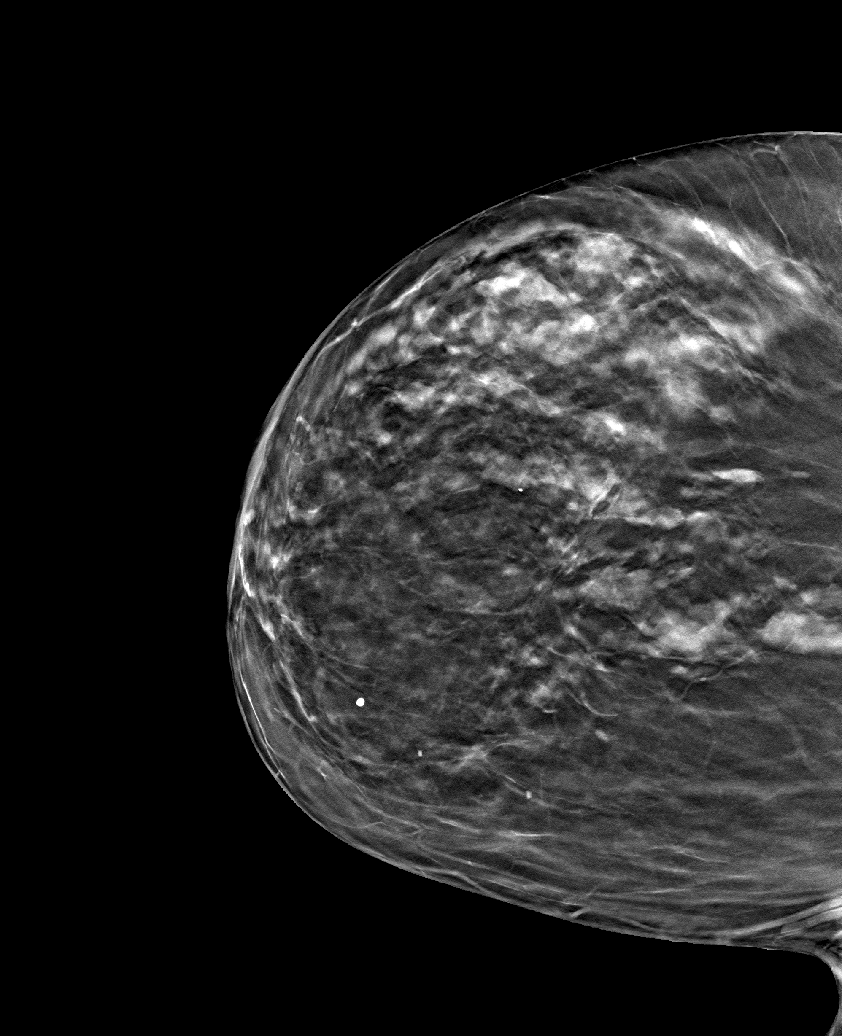

[6 of 30 positions shown; findings below may reference images not displayed]

ACR Breast Density Category c: The breast tissue is heterogeneously
dense, which may obscure small masses.
FINDINGS: There are no findings suspicious for malignancy.
IMPRESSION: No mammographic evidence of malignancy. A result letter of this
screening mammogram will be mailed directly to the patient.

RECOMMENDATION:
Screening mammogram in one year. (Code:Q3-W-BC3)

BI-RADS CATEGORY  1: Negative.

## 2022-11-18 ENCOUNTER — Other Ambulatory Visit: Payer: Self-pay | Admitting: Family Medicine

## 2022-11-18 DIAGNOSIS — Z1231 Encounter for screening mammogram for malignant neoplasm of breast: Secondary | ICD-10-CM

## 2022-11-20 ENCOUNTER — Ambulatory Visit
Admission: RE | Admit: 2022-11-20 | Discharge: 2022-11-20 | Disposition: A | Discharge status: Home / Self Care | Payer: Medicare HMO | Source: Ambulatory Visit | Attending: Family Medicine | Admitting: Family Medicine

## 2022-11-20 DIAGNOSIS — Z1231 Encounter for screening mammogram for malignant neoplasm of breast: Secondary | ICD-10-CM | POA: Diagnosis present

## 2022-11-28 ENCOUNTER — Other Ambulatory Visit: Payer: Self-pay | Admitting: Family Medicine

## 2022-11-28 DIAGNOSIS — R921 Mammographic calcification found on diagnostic imaging of breast: Secondary | ICD-10-CM

## 2022-11-28 DIAGNOSIS — R928 Other abnormal and inconclusive findings on diagnostic imaging of breast: Secondary | ICD-10-CM

## 2022-12-01 ENCOUNTER — Ambulatory Visit
Admission: RE | Admit: 2022-12-01 | Discharge: 2022-12-01 | Disposition: A | Discharge status: Home / Self Care | Payer: Medicare HMO | Source: Ambulatory Visit | Attending: Family Medicine | Admitting: Family Medicine

## 2022-12-01 DIAGNOSIS — R921 Mammographic calcification found on diagnostic imaging of breast: Secondary | ICD-10-CM | POA: Insufficient documentation

## 2022-12-01 DIAGNOSIS — R928 Other abnormal and inconclusive findings on diagnostic imaging of breast: Secondary | ICD-10-CM | POA: Diagnosis present

## 2022-12-03 ENCOUNTER — Other Ambulatory Visit: Payer: Self-pay | Admitting: Family Medicine

## 2022-12-03 DIAGNOSIS — R921 Mammographic calcification found on diagnostic imaging of breast: Secondary | ICD-10-CM

## 2022-12-03 DIAGNOSIS — R928 Other abnormal and inconclusive findings on diagnostic imaging of breast: Secondary | ICD-10-CM

## 2022-12-25 ENCOUNTER — Ambulatory Visit
Admission: RE | Admit: 2022-12-25 | Discharge: 2022-12-25 | Disposition: A | Discharge status: Home / Self Care | Payer: Medicare HMO | Source: Ambulatory Visit | Attending: Family Medicine | Admitting: Family Medicine

## 2022-12-25 DIAGNOSIS — R921 Mammographic calcification found on diagnostic imaging of breast: Secondary | ICD-10-CM | POA: Insufficient documentation

## 2022-12-25 DIAGNOSIS — N6489 Other specified disorders of breast: Secondary | ICD-10-CM | POA: Diagnosis present

## 2022-12-25 DIAGNOSIS — R928 Other abnormal and inconclusive findings on diagnostic imaging of breast: Secondary | ICD-10-CM | POA: Insufficient documentation

## 2022-12-25 HISTORY — PX: BREAST BIOPSY: SHX20

## 2022-12-25 MED ORDER — LIDOCAINE-EPINEPHRINE 1 %-1:100000 IJ SOLN
20.0000 mL | Freq: Once | INTRAMUSCULAR | Status: AC
  Administered 2022-12-25: 20 mL

## 2022-12-25 MED ORDER — LIDOCAINE 1 % OPTIME INJ - NO CHARGE
5.0000 mL | Freq: Once | INTRAMUSCULAR | Status: AC
  Administered 2022-12-25: 5 mL

## 2022-12-26 LAB — SURGICAL PATHOLOGY

## 2024-02-08 ENCOUNTER — Other Ambulatory Visit: Payer: Self-pay | Admitting: Family Medicine

## 2024-02-08 DIAGNOSIS — Z1231 Encounter for screening mammogram for malignant neoplasm of breast: Secondary | ICD-10-CM

## 2024-02-16 ENCOUNTER — Ambulatory Visit
Admission: RE | Admit: 2024-02-16 | Discharge: 2024-02-16 | Disposition: A | Discharge status: Home / Self Care | Source: Ambulatory Visit | Attending: Family Medicine | Admitting: Family Medicine

## 2024-02-16 DIAGNOSIS — Z1231 Encounter for screening mammogram for malignant neoplasm of breast: Secondary | ICD-10-CM | POA: Insufficient documentation
